# Patient Record
Sex: Male | Born: 1965 | Race: Black or African American | Hispanic: No | Marital: Single | Smoking: Current every day smoker
Health system: Southern US, Community
[De-identification: ages and names within clinical notes are randomized; demographics above are authoritative.]

## PROBLEM LIST (undated history)

## (undated) DIAGNOSIS — I749 Embolism and thrombosis of unspecified artery: Secondary | ICD-10-CM

## (undated) DIAGNOSIS — T783XXA Angioneurotic edema, initial encounter: Secondary | ICD-10-CM

## (undated) DIAGNOSIS — I10 Essential (primary) hypertension: Secondary | ICD-10-CM

## (undated) HISTORY — DX: Essential (primary) hypertension: I10

## (undated) HISTORY — PX: EMBOLECTOMY: SHX44

---

## 1998-02-27 ENCOUNTER — Emergency Department (HOSPITAL_COMMUNITY): Admission: EM | Admit: 1998-02-27 | Discharge: 1998-02-27 | Payer: Self-pay | Admitting: Emergency Medicine

## 1999-12-23 ENCOUNTER — Encounter: Payer: Self-pay | Admitting: Emergency Medicine

## 1999-12-23 ENCOUNTER — Emergency Department (HOSPITAL_COMMUNITY): Admission: EM | Admit: 1999-12-23 | Discharge: 1999-12-23 | Payer: Self-pay | Admitting: Emergency Medicine

## 2002-11-15 ENCOUNTER — Emergency Department (HOSPITAL_COMMUNITY): Admission: EM | Admit: 2002-11-15 | Discharge: 2002-11-15 | Payer: Self-pay | Admitting: Emergency Medicine

## 2002-11-18 ENCOUNTER — Emergency Department (HOSPITAL_COMMUNITY): Admission: EM | Admit: 2002-11-18 | Discharge: 2002-11-19 | Payer: Self-pay | Admitting: Emergency Medicine

## 2002-12-07 ENCOUNTER — Other Ambulatory Visit (HOSPITAL_COMMUNITY): Admission: RE | Admit: 2002-12-07 | Discharge: 2002-12-23 | Payer: Self-pay | Admitting: Psychiatry

## 2003-12-30 ENCOUNTER — Emergency Department (HOSPITAL_COMMUNITY): Admission: EM | Admit: 2003-12-30 | Discharge: 2003-12-30 | Payer: Self-pay | Admitting: Family Medicine

## 2004-02-29 ENCOUNTER — Emergency Department (HOSPITAL_COMMUNITY): Admission: EM | Admit: 2004-02-29 | Discharge: 2004-02-29 | Payer: Self-pay | Admitting: Emergency Medicine

## 2004-03-10 ENCOUNTER — Emergency Department (HOSPITAL_COMMUNITY): Admission: EM | Admit: 2004-03-10 | Discharge: 2004-03-10 | Payer: Self-pay | Admitting: Emergency Medicine

## 2004-06-10 ENCOUNTER — Emergency Department (HOSPITAL_COMMUNITY): Admission: EM | Admit: 2004-06-10 | Discharge: 2004-06-10 | Payer: Self-pay | Admitting: Emergency Medicine

## 2006-10-18 ENCOUNTER — Emergency Department (HOSPITAL_COMMUNITY): Admission: EM | Admit: 2006-10-18 | Discharge: 2006-10-18 | Payer: Self-pay | Admitting: Emergency Medicine

## 2007-04-16 ENCOUNTER — Emergency Department (HOSPITAL_COMMUNITY): Admission: EM | Admit: 2007-04-16 | Discharge: 2007-04-16 | Payer: Self-pay | Admitting: Emergency Medicine

## 2007-04-17 ENCOUNTER — Emergency Department (HOSPITAL_COMMUNITY): Admission: EM | Admit: 2007-04-17 | Discharge: 2007-04-17 | Payer: Self-pay | Admitting: Emergency Medicine

## 2008-05-15 ENCOUNTER — Emergency Department (HOSPITAL_COMMUNITY): Admission: EM | Admit: 2008-05-15 | Discharge: 2008-05-15 | Payer: Self-pay | Admitting: Emergency Medicine

## 2010-02-23 ENCOUNTER — Emergency Department (HOSPITAL_COMMUNITY)
Admission: EM | Admit: 2010-02-23 | Discharge: 2010-02-23 | Disposition: A | Discharge status: Home / Self Care | Payer: Self-pay | Attending: Emergency Medicine | Admitting: Emergency Medicine

## 2010-02-23 ENCOUNTER — Emergency Department (HOSPITAL_COMMUNITY): Payer: Self-pay

## 2010-02-23 DIAGNOSIS — R079 Chest pain, unspecified: Secondary | ICD-10-CM | POA: Insufficient documentation

## 2010-04-23 LAB — BASIC METABOLIC PANEL
Chloride: 105 mEq/L (ref 96–112)
Creatinine, Ser: 0.78 mg/dL (ref 0.4–1.5)
GFR calc Af Amer: >60 mL/min (ref >60)
GFR calc non Af Amer: >60 mL/min (ref >60)

## 2010-04-23 LAB — DIFFERENTIAL
Eosinophils Absolute: 0.2 10*3/uL (ref 0.0–0.7)
Lymphocytes Relative: 28 % (ref 12–46)
Lymphs Abs: 1.7 10*3/uL (ref 0.7–4.0)
Monocytes Relative: 8 % (ref 3–12)
Neutro Abs: 3.4 10*3/uL (ref 1.7–7.7)
Neutrophils Relative %: 60 % (ref 43–77)

## 2010-04-23 LAB — CBC
MCV: 92 fL (ref 78.0–100.0)
RBC: 4.17 MIL/uL — ABNORMAL LOW (ref 4.22–5.81)
WBC: 5.9 10*3/uL (ref 4.0–10.5)

## 2010-04-23 LAB — TROPONIN I: Troponin I: 0.01 ng/mL (ref 0.00–0.06)

## 2010-11-30 ENCOUNTER — Emergency Department (HOSPITAL_COMMUNITY)
Admission: EM | Admit: 2010-11-30 | Discharge: 2010-11-30 | Disposition: A | Discharge status: Home / Self Care | Payer: Self-pay | Attending: Emergency Medicine | Admitting: Emergency Medicine

## 2010-11-30 DIAGNOSIS — R220 Localized swelling, mass and lump, head: Secondary | ICD-10-CM | POA: Insufficient documentation

## 2010-11-30 DIAGNOSIS — F17200 Nicotine dependence, unspecified, uncomplicated: Secondary | ICD-10-CM | POA: Insufficient documentation

## 2010-11-30 DIAGNOSIS — L0390 Cellulitis, unspecified: Secondary | ICD-10-CM

## 2010-11-30 DIAGNOSIS — L0201 Cutaneous abscess of face: Secondary | ICD-10-CM | POA: Insufficient documentation

## 2010-11-30 DIAGNOSIS — R221 Localized swelling, mass and lump, neck: Secondary | ICD-10-CM | POA: Insufficient documentation

## 2010-11-30 DIAGNOSIS — L03211 Cellulitis of face: Secondary | ICD-10-CM | POA: Insufficient documentation

## 2010-11-30 HISTORY — DX: Embolism and thrombosis of unspecified artery: I74.9

## 2010-11-30 MED ORDER — OXYCODONE-ACETAMINOPHEN 5-325 MG PO TABS: 1.0000 | ORAL_TABLET | Freq: Four times a day (QID) | ORAL | Status: AC | PRN

## 2010-11-30 MED ORDER — DOXYCYCLINE HYCLATE 100 MG PO CAPS: 100.0000 mg | ORAL_CAPSULE | Freq: Two times a day (BID) | ORAL | Status: AC

## 2010-11-30 NOTE — ED Notes (Signed)
Pt c/o "angioedema". Denies taking medications that would cause this. States that these "breakouts" are occurring more frequently.

## 2010-11-30 NOTE — ED Provider Notes (Signed)
History     CSN: [id number] Arrival date & time: 11/30/2010  3:31 AM   First MD Initiated Contact with Patient 11/30/10 0355      Chief Complaint  Patient presents with  . Facial Swelling    right ear    HPI: The history is provided by the patient.  Reports noticing swelling to (R) side of face late yesterday that worsened through-out the night. 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  Past Medical History  Diagnosis Date  . Embolism - blood clot     in occipital lobe    Past Surgical History  Procedure Date  . Embolectomy     occipital lobe    Family History  Problem Relation Age of Onset  . Hypertension Mother     History  Substance Use Topics  . Smoking status: Current Everyday Smoker  . Smokeless tobacco: Not on file  . Alcohol Use: No      Review of Systems  Constitutional: Negative.   HENT: Negative.   Eyes: Negative.   Respiratory: Negative.   Cardiovascular: Negative.   Gastrointestinal: Negative.   Genitourinary: Negative.   Musculoskeletal: Negative.   Skin: Negative.   Neurological: Negative.   Hematological: Negative.   Psychiatric/Behavioral: Negative.     Allergies  Review of patient's allergies indicates no known allergies.  Home Medications  No current outpatient prescriptions on file.  BP 156/94  Pulse 76  Temp 97.6 F (36.4 C)  Resp 18  SpO2 99%  Physical Exam  Constitutional: He is oriented to person, place, and time. He appears well-developed and well-nourished.  HENT:  Head: Atraumatic.         Erythema and swelling to (R) earlobe that extends just anterior to the earlobe approx 1-2 cm and down the mandibular region approx 2-3 cm. Area is TTP. No discernable nodular or abscess formation or palpable fluctuance.  Eyes: Conjunctivae are normal.  Neck: Neck supple.  Cardiovascular: Normal rate and regular rhythm.   Pulmonary/Chest: Effort normal and breath sounds normal.  Abdominal: Soft.  Bowel sounds are normal.  Lymphadenopathy:    He has no cervical adenopathy.  Neurological: He is alert and oriented to person, place, and time.  Skin: Skin is warm and dry.  Psychiatric: He has a normal mood and affect.    ED Course  Procedures Clinical impression and d/c plan discussed. Will treat cellulitis and encourage warm compresses and f/u in 2 days for wound recheck at [facility]. Pt agreeable w/ plan.  cussed.   Labs Reviewed - No data to display No results found.   No diagnosis found.    MDM  Findings c/w localized cellulitis (?abscess).      Leanne Chang, NP 12/03/10 [phone]

## 2010-12-04 NOTE — ED Provider Notes (Signed)
Evaluation and management procedures were performed by the mid-level provider (PA/NP/CNM) under my supervision/collaboration. I was present and available during the ED course. Jamilya Sarrazin Y.   Gavin Pound. Ruairi Stutsman, MD 12/04/10 1008

## 2012-03-21 ENCOUNTER — Emergency Department (HOSPITAL_COMMUNITY)
Admission: EM | Admit: 2012-03-21 | Discharge: 2012-03-21 | Disposition: A | Discharge status: Home / Self Care | Payer: BC Managed Care – PPO | Attending: Emergency Medicine | Admitting: Emergency Medicine

## 2012-03-21 ENCOUNTER — Encounter (HOSPITAL_COMMUNITY): Payer: Self-pay | Admitting: Emergency Medicine

## 2012-03-21 DIAGNOSIS — R51 Headache: Secondary | ICD-10-CM

## 2012-03-21 DIAGNOSIS — F17200 Nicotine dependence, unspecified, uncomplicated: Secondary | ICD-10-CM | POA: Insufficient documentation

## 2012-03-21 DIAGNOSIS — Y92410 Unspecified street and highway as the place of occurrence of the external cause: Secondary | ICD-10-CM | POA: Insufficient documentation

## 2012-03-21 DIAGNOSIS — Y9389 Activity, other specified: Secondary | ICD-10-CM | POA: Insufficient documentation

## 2012-03-21 DIAGNOSIS — Z8679 Personal history of other diseases of the circulatory system: Secondary | ICD-10-CM | POA: Insufficient documentation

## 2012-03-21 DIAGNOSIS — S0990XA Unspecified injury of head, initial encounter: Secondary | ICD-10-CM | POA: Insufficient documentation

## 2012-03-21 MED ORDER — IBUPROFEN 200 MG PO TABS
600.0000 mg | ORAL_TABLET | Freq: Once | ORAL | Status: AC
  Administered 2012-03-21: 600 mg via ORAL
  Filled 2012-03-21: qty 1

## 2012-03-21 MED ORDER — HYDROCODONE-ACETAMINOPHEN 5-325 MG PO TABS: 1.0000 | ORAL_TABLET | ORAL | Status: DC | PRN

## 2012-03-21 NOTE — ED Notes (Signed)
Vision Check:  OU-both eyes- 20/50 OD-rt eye- 20/50 OS-lt eye-20/50
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
## 2012-03-24 ENCOUNTER — Emergency Department (HOSPITAL_COMMUNITY): Payer: BC Managed Care – PPO

## 2012-03-24 ENCOUNTER — Emergency Department (HOSPITAL_COMMUNITY)
Admission: EM | Admit: 2012-03-24 | Discharge: 2012-03-24 | Disposition: A | Discharge status: Home / Self Care | Payer: BC Managed Care – PPO | Attending: Emergency Medicine | Admitting: Emergency Medicine

## 2012-03-24 ENCOUNTER — Encounter (HOSPITAL_COMMUNITY): Payer: Self-pay | Admitting: Emergency Medicine

## 2012-03-24 DIAGNOSIS — R42 Dizziness and giddiness: Secondary | ICD-10-CM | POA: Insufficient documentation

## 2012-03-24 DIAGNOSIS — Z86718 Personal history of other venous thrombosis and embolism: Secondary | ICD-10-CM | POA: Insufficient documentation

## 2012-03-24 DIAGNOSIS — H53149 Visual discomfort, unspecified: Secondary | ICD-10-CM | POA: Insufficient documentation

## 2012-03-24 DIAGNOSIS — F17200 Nicotine dependence, unspecified, uncomplicated: Secondary | ICD-10-CM | POA: Insufficient documentation

## 2012-03-24 DIAGNOSIS — S0990XD Unspecified injury of head, subsequent encounter: Secondary | ICD-10-CM

## 2012-03-24 DIAGNOSIS — S0990XA Unspecified injury of head, initial encounter: Secondary | ICD-10-CM | POA: Insufficient documentation

## 2012-03-24 DIAGNOSIS — R51 Headache: Secondary | ICD-10-CM

## 2012-03-24 DIAGNOSIS — Y9389 Activity, other specified: Secondary | ICD-10-CM | POA: Insufficient documentation

## 2012-03-24 DIAGNOSIS — M542 Cervicalgia: Secondary | ICD-10-CM | POA: Insufficient documentation

## 2012-03-24 MED ORDER — HYDROMORPHONE HCL PF 1 MG/ML IJ SOLN
1.0000 mg | Freq: Once | INTRAMUSCULAR | Status: AC
  Administered 2012-03-24: 1 mg via INTRAVENOUS
  Filled 2012-03-24: qty 1

## 2012-03-24 MED ORDER — SODIUM CHLORIDE 0.9 % IV SOLN
Freq: Once | INTRAVENOUS | Status: AC
  Administered 2012-03-24: 21:00:00 via INTRAVENOUS

## 2012-03-24 MED ORDER — ONDANSETRON 8 MG PO TBDP
8.0000 mg | ORAL_TABLET | Freq: Once | ORAL | Status: AC
  Administered 2012-03-24: 8 mg via ORAL
  Filled 2012-03-24: qty 1
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
## 2012-03-24 NOTE — ED Notes (Signed)
Pt states that he got a sharp headache right behind his ear that he states was about a 6/10 on pain scale. States that the pain was so bad that it made him dizzy.  States this pain only lasted for a moment.  Now he is c/o a headache behind his right eye 3/10.  Denies dizziness at the moment.

## 2012-03-27 NOTE — ED Provider Notes (Signed)
Medical screening examination/treatment/procedure(s) were performed by non-physician practitioner and as supervising physician I was immediately available for consultation/collaboration.  Raeford Razor, MD 03/27/12 1046

## 2012-05-21 ENCOUNTER — Encounter (HOSPITAL_COMMUNITY): Payer: Self-pay | Admitting: *Deleted

## 2012-05-21 ENCOUNTER — Emergency Department (HOSPITAL_COMMUNITY)
Admission: EM | Admit: 2012-05-21 | Discharge: 2012-05-21 | Disposition: A | Discharge status: Home / Self Care | Payer: BC Managed Care – PPO | Attending: Emergency Medicine | Admitting: Emergency Medicine

## 2012-05-21 ENCOUNTER — Other Ambulatory Visit: Payer: Self-pay

## 2012-05-21 ENCOUNTER — Emergency Department (HOSPITAL_COMMUNITY): Payer: BC Managed Care – PPO

## 2012-05-21 DIAGNOSIS — Z87828 Personal history of other (healed) physical injury and trauma: Secondary | ICD-10-CM | POA: Insufficient documentation

## 2012-05-21 DIAGNOSIS — R071 Chest pain on breathing: Secondary | ICD-10-CM | POA: Insufficient documentation

## 2012-05-21 DIAGNOSIS — F17200 Nicotine dependence, unspecified, uncomplicated: Secondary | ICD-10-CM | POA: Insufficient documentation

## 2012-05-21 DIAGNOSIS — R0789 Other chest pain: Secondary | ICD-10-CM

## 2012-05-21 DIAGNOSIS — Z8679 Personal history of other diseases of the circulatory system: Secondary | ICD-10-CM | POA: Insufficient documentation

## 2012-05-21 DIAGNOSIS — R079 Chest pain, unspecified: Secondary | ICD-10-CM

## 2012-05-21 HISTORY — DX: Angioneurotic edema, initial encounter: T78.3XXA

## 2012-05-21 LAB — POCT I-STAT, CHEM 8
BUN: 4 mg/dL — ABNORMAL LOW (ref 6–23)
Calcium, Ion: 1.21 mmol/L (ref 1.12–1.23)
Chloride: 107 mEq/L (ref 96–112)
Creatinine, Ser: 1 mg/dL (ref 0.50–1.35)
HCT: 41 % (ref 39.0–52.0)
Potassium: 3.9 mEq/L (ref 3.5–5.1)
Sodium: 143 mEq/L (ref 135–145)

## 2012-05-21 LAB — POCT I-STAT TROPONIN I: Troponin i, poc: 0 ng/mL (ref 0.00–0.08)

## 2012-05-21 MED ORDER — IBUPROFEN 600 MG PO TABS: 600.0000 mg | ORAL_TABLET | Freq: Four times a day (QID) | ORAL | Status: DC | PRN

## 2012-05-21 MED ORDER — IBUPROFEN 800 MG PO TABS
800.0000 mg | ORAL_TABLET | Freq: Once | ORAL | Status: AC
  Administered 2012-05-21: 800 mg via ORAL
  Filled 2012-05-21: qty 1

## 2012-05-21 MED ORDER — ASPIRIN 81 MG PO CHEW
324.0000 mg | CHEWABLE_TABLET | Freq: Once | ORAL | Status: AC
  Administered 2012-05-21: 324 mg via ORAL
  Filled 2012-05-21: qty 4

## 2012-05-21 NOTE — ED Notes (Signed)
Per pt report: Pt c/o of chest pain that began at 19:00 that subsided but then came back around 1:00.  Pt describes it as "some times it was pain and sometimes it was pressure, sometimes it was both."  Pt was at work when the pain started.  Pt was sitting down and got up and felt the  Pain. Pt denies n/v, sob, dizziness, fatigue. Pt reports smoking. Pt a/o x 4.  Skin warm and dry.
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
## 2012-11-27 ENCOUNTER — Encounter (HOSPITAL_COMMUNITY): Payer: Self-pay | Admitting: Emergency Medicine

## 2012-11-27 ENCOUNTER — Emergency Department (HOSPITAL_COMMUNITY)
Admission: EM | Admit: 2012-11-27 | Discharge: 2012-11-27 | Disposition: A | Discharge status: Home / Self Care | Payer: BC Managed Care – PPO | Attending: Emergency Medicine | Admitting: Emergency Medicine

## 2012-11-27 DIAGNOSIS — Z86711 Personal history of pulmonary embolism: Secondary | ICD-10-CM | POA: Insufficient documentation

## 2012-11-27 DIAGNOSIS — F17200 Nicotine dependence, unspecified, uncomplicated: Secondary | ICD-10-CM | POA: Insufficient documentation

## 2012-11-27 DIAGNOSIS — I10 Essential (primary) hypertension: Secondary | ICD-10-CM | POA: Insufficient documentation

## 2012-11-27 DIAGNOSIS — IMO0002 Reserved for concepts with insufficient information to code with codable children: Secondary | ICD-10-CM | POA: Insufficient documentation

## 2012-11-27 DIAGNOSIS — T783XXA Angioneurotic edema, initial encounter: Secondary | ICD-10-CM | POA: Insufficient documentation

## 2012-11-27 MED ORDER — DIPHENHYDRAMINE HCL 50 MG/ML IJ SOLN
50.0000 mg | Freq: Once | INTRAMUSCULAR | Status: AC
  Administered 2012-11-27: 50 mg via INTRAVENOUS
  Filled 2012-11-27: qty 1

## 2012-11-27 MED ORDER — DIPHENHYDRAMINE HCL 25 MG PO TABS: 25.0000 mg | ORAL_TABLET | Freq: Four times a day (QID) | ORAL | Status: DC

## 2012-11-27 MED ORDER — FAMOTIDINE 20 MG PO TABS
20.0000 mg | ORAL_TABLET | Freq: Once | ORAL | Status: AC
  Administered 2012-11-27: 20 mg via ORAL
  Filled 2012-11-27: qty 1

## 2012-11-27 MED ORDER — METHYLPREDNISOLONE SODIUM SUCC 125 MG IJ SOLR
125.0000 mg | Freq: Once | INTRAMUSCULAR | Status: AC
  Administered 2012-11-27: 125 mg via INTRAVENOUS
  Filled 2012-11-27: qty 2

## 2012-11-27 MED ORDER — PREDNISONE 10 MG PO TABS: 60.0000 mg | ORAL_TABLET | Freq: Every day | ORAL | Status: DC

## 2012-11-27 NOTE — ED Provider Notes (Signed)
CSN: [id number]     Arrival date & time 11/27/12  0016 History   First MD Initiated Contact with Patient 11/27/12 0023     Chief Complaint  Patient presents with  . Angioedema    The history is provided by the patient.   patient reports swelling of his upper lip over the past hour.  He states it is worsened slightly.  No difficulty breathing or swallowing.  He states his head angioedema before in the past and this feels similar.  He is not on ACE inhibitors.  He denies use of anti-inflammatory medications today.  He states this has occurred for the times before in the past.  He seen an allergist/immunologist without an etiology as to the cause of his symptoms.  His last episode occurred approximately a year and a half ago he was seen in outside hospital.  He's never had difficulty breathing or swallowing.  He's never been intubated for his angioedema.  It always seems to be isolated to his lips.  It is usually his upper lip but has involved his lower lip before.   Past Medical History  Diagnosis Date  . 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 Angioedema    Past Surgical History  Procedure Laterality Date  . Embolectomy      occipital lobe   Family History  Problem Relation Age of Onset  . Hypertension Mother    History  Substance Use Topics  . Smoking status: Current Every Day Smoker    Types: Cigars  . Smokeless tobacco: Not on file  . Alcohol Use: Yes     Comment: Social    Review of Systems  All other systems reviewed and are negative.    Allergies  Review of patient's allergies indicates no known allergies.  Home Medications   Current Outpatient Rx  Name  Route  Sig  Dispense  Refill  . diphenhydrAMINE (BENADRYL) 25 MG tablet   Oral   Take 1 tablet (25 mg total) by mouth every 6 (six) hours.   20 tablet   0   . predniSONE (DELTASONE) 10 MG tablet   Oral   Take 6 tablets (60 mg total) by mouth daily.   30 tablet   0    BP 125/80  Pulse 82   Temp(Src) 98.3 F (36.8 C) (Oral)  Resp 18  Ht 5\' 8"  (1.727 m)  Wt 203 lb (92.08 kg)  BMI 30.87 kg/m2  SpO2 97% Physical Exam  Nursing note and vitals reviewed. Constitutional: He is oriented to person, place, and time. He appears well-developed and well-nourished.  HENT:  Head: Normocephalic and atraumatic.  Angioedema right upper.  No tongue involvement.  Uvula is midline.  No uvular swelling.  Oral airway is patent.  Tolerating secretions.  No stridor.  Eyes: EOM are normal.  Neck: Normal range of motion.  Cardiovascular: Normal rate, regular rhythm, normal heart sounds and intact distal pulses.   Pulmonary/Chest: Effort normal and breath sounds normal. No stridor. No respiratory distress.  Abdominal: Soft. He exhibits no distension. There is no tenderness.  Musculoskeletal: Normal range of motion.  Neurological: He is alert and oriented to person, place, and time.  Skin: Skin is warm and dry.  Psychiatric: He has a normal mood and affect. Judgment normal.    ED Course  Procedures (including critical care time) Labs Review Labs Reviewed - No data to display Imaging Review No results found.  EKG Interpretation   None  MDM   1. 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  He is not on ACE inhibitors.  He has had no anti-inflammatories today.  He has seen the allergist without a true diagnosis as to why this occurs.  Patient was observed in the ER for 4 hours.  His had no worsening of his symptoms.  Seems to be isolated to his upper lip.  Discharge home in good condition.    Lyanne Co, MD 11/27/12 [phone]

## 2012-11-27 NOTE — ED Notes (Signed)
Pt presents w/ significant swelling to right side of his upper lip.  Per pt this has happened in the past.  Approximately 40 mins. Ago pt began to feel a "tingling" sensation and that is when the swelling began.  Pt denies any tingling in his tongue or throat.

## 2012-11-27 NOTE — ED Notes (Signed)
The swelling in pt's right upper lip is extending into left side of upper lip at this time.  Respiratory effort remains unlabored and pt continues to deny any tingling sensation in his tongue or throat.

## 2013-08-07 ENCOUNTER — Encounter (HOSPITAL_COMMUNITY): Payer: Self-pay | Admitting: Emergency Medicine

## 2013-08-07 ENCOUNTER — Emergency Department (HOSPITAL_COMMUNITY)
Admission: EM | Admit: 2013-08-07 | Discharge: 2013-08-07 | Disposition: A | Discharge status: Home / Self Care | Payer: BC Managed Care – PPO | Attending: Emergency Medicine | Admitting: Emergency Medicine

## 2013-08-07 DIAGNOSIS — R079 Chest pain, unspecified: Secondary | ICD-10-CM

## 2013-08-07 DIAGNOSIS — Z86718 Personal history of other venous thrombosis and embolism: Secondary | ICD-10-CM | POA: Insufficient documentation

## 2013-08-07 DIAGNOSIS — F17200 Nicotine dependence, unspecified, uncomplicated: Secondary | ICD-10-CM | POA: Insufficient documentation

## 2013-08-07 DIAGNOSIS — Z79899 Other long term (current) drug therapy: Secondary | ICD-10-CM | POA: Insufficient documentation

## 2013-08-07 LAB — CBC
HEMATOCRIT: 39.6 % (ref 39.0–52.0)
Hemoglobin: 13.6 g/dL (ref 13.0–17.0)
MCH: 30.4 pg (ref 26.0–34.0)
MCHC: 34.3 g/dL (ref 30.0–36.0)
MCV: 88.6 fL (ref 78.0–100.0)
PLATELETS: 271 10*3/uL (ref 150–400)
RBC: 4.47 MIL/uL (ref 4.22–5.81)
RDW: 14.1 % (ref 11.5–15.5)
WBC: 8.5 10*3/uL (ref 4.0–10.5)

## 2013-08-07 LAB — I-STAT TROPONIN, ED: Troponin i, poc: 0 ng/mL (ref 0.00–0.08)

## 2013-08-07 LAB — BASIC METABOLIC PANEL
ANION GAP: 11 (ref 5–15)
BUN: 8 mg/dL (ref 6–23)
CALCIUM: 9.2 mg/dL (ref 8.4–10.5)
CHLORIDE: 105 meq/L (ref 96–112)
CO2: 26 mEq/L (ref 19–32)
CREATININE: 0.83 mg/dL (ref 0.50–1.35)
GFR calc Af Amer: >90 mL/min (ref >90)
Glucose, Bld: 100 mg/dL — ABNORMAL HIGH (ref 70–99)
Potassium: 4 mEq/L (ref 3.7–5.3)
Sodium: 142 mEq/L (ref 137–147)

## 2013-08-07 MED ORDER — PANTOPRAZOLE SODIUM 20 MG PO TBEC: 20.0000 mg | DELAYED_RELEASE_TABLET | Freq: Every day | ORAL | Status: AC

## 2013-08-07 MED ORDER — ASPIRIN 81 MG PO CHEW
324.0000 mg | CHEWABLE_TABLET | Freq: Once | ORAL | Status: AC
  Administered 2013-08-07: 324 mg via ORAL

## 2013-08-07 MED ORDER — GI COCKTAIL ~~LOC~~
30.0000 mL | Freq: Once | ORAL | Status: AC
  Administered 2013-08-07: 30 mL via ORAL
  Filled 2013-08-07: qty 30

## 2013-08-07 MED ORDER — ASPIRIN 81 MG PO CHEW
CHEWABLE_TABLET | ORAL | Status: AC
  Filled 2013-08-07: qty 4

## 2013-08-07 NOTE — ED Provider Notes (Signed)
CSN: [id number]     Arrival date & time 08/07/13  1324 History   First MD Initiated Contact with Patient 08/07/13 1345     Chief Complaint  Patient presents with  . Chest Pain     (Consider location/radiation/quality/duration/timing/severity/associated sxs/prior Treatment) Patient is a 48 y.o. male presenting with chest pain. The history is provided by the patient.  Chest Pain  He presents for evaluation of right anterior chest pain, present for 4 or 5 days, waxing and waning. The discomfort has been as bad as 8/10, and on arrival is 4/10. The pain is pressure-like. There is no associated fever, chills, cough, shortness of breath, nausea, vomiting, or diaphoresis. He has never had this previously. He denies problems eating, walking, or taking  medications. There are no other known modifying factors.   Past Medical History  Diagnosis Date  . 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 Angioedema    Past Surgical History  Procedure Laterality Date  . Embolectomy      occipital lobe   Family History  Problem Relation Age of Onset  . Hypertension Mother    History  Substance Use Topics  . Smoking status: Current Every Day Smoker    Types: Cigars  . Smokeless tobacco: Not on file  . Alcohol Use: Yes     Comment: Social    Review of Systems  Cardiovascular: Positive for chest pain.  All other systems reviewed and are negative.     Allergies  Review of patient's allergies indicates no known allergies.  Home Medications   Prior to Admission medications   Medication Sig Start Date End Date Taking? Authorizing Provider  pantoprazole (PROTONIX) 20 MG tablet Take 1 tablet (20 mg total) by mouth daily. 08/07/13   Flint MelterElliott L Treshawn Allen, MD   BP 146/99  Pulse 65  Temp(Src) 98.3 F (36.8 C) (Oral)  Resp 14  SpO2 100% Physical Exam  Nursing note and vitals reviewed. Constitutional: He is oriented to person, place, and time. He appears well-developed and well-nourished. No  distress.  HENT:  Head: Normocephalic and atraumatic.  Right Ear: External ear normal.  Left Ear: External ear normal.  Eyes: Conjunctivae and EOM are normal. Pupils are equal, round, and reactive to light.  Neck: Normal range of motion and phonation normal. Neck supple.  Cardiovascular: Normal rate, regular rhythm, normal heart sounds and intact distal pulses.   Pulmonary/Chest: Effort normal and breath sounds normal. He exhibits no tenderness and no bony tenderness.  Abdominal: Soft. There is no tenderness.  Musculoskeletal: Normal range of motion.  Neurological: He is alert and oriented to person, place, and time. No cranial nerve deficit or sensory deficit. He exhibits normal muscle tone. Coordination normal.  Skin: Skin is warm, dry and intact.  Psychiatric: He has a normal mood and affect. His behavior is normal. Judgment and thought content normal.    ED Course  Procedures (including critical care time)  13:55- completed call with, Dr. Herbie BaltimoreHarding, on call for STEMI; he and I agree, that the EKG today (13:32;00) does not meet criteria for ST segment elevation MI.  Medications  aspirin chewable tablet 324 mg (324 mg Oral Given 08/07/13 1351)  gi cocktail (Maalox,Lidocaine,Donnatal) (30 mLs Oral Given 08/07/13 1555)    Patient Vitals for the past 24 hrs:  BP Temp Temp src Pulse Resp SpO2  08/07/13 1555 146/99 mmHg - - 65 14 100 %  08/07/13 1335 137/85 mmHg 98.3 F (36.8 C) Oral 70  18 100 %   3:50 PM Reevaluation with update and discussion. After initial assessment and treatment, an updated evaluation reveals he, states that the chest pain is almost gone, but he is having pain with swallowing and is being bothered by hicoughs. Agness Sibrian L   4:16 PM Reevaluation with update and discussion. After initial assessment and treatment, an updated evaluation reveals he feels some better after GI cocktail. Findings discussed with patient, all questions answered.Mancel Bale L   Labs  Review Labs Reviewed  BASIC METABOLIC PANEL - Abnormal; Notable for the following:    Glucose, Bld 100 (*)    All other components within normal limits  CBC  I-STAT TROPOININ, ED    Imaging Review No results found.   EKG Interpretation   Date/Time:  Sunday August 07 2013 13:32:00 EDT Ventricular Rate:  73 PR Interval:  173 QRS Duration: 111 QT Interval:  378 QTC Calculation: 416 R Axis:   -11 Text Interpretation:  Sinus rhythm Probable left atrial enlargement  Nonspecific T abnormalities, inferior leads Minimal ST elevation, anterior  leads Baseline wander in lead(s) V6 since last tracing no significant  change Confirmed by Effie Shy  MD, Mechele Collin ([id number]) on 08/07/2013 1:55:01 PM      MDM   Final diagnoses:  Nonspecific chest pain    Nonspecific chest pain, low risk for coronary disease, and no clear evidence for ACS. The symptoms seem to GI related. He is stable for discharge with outpatient evaluation and treatment. The patient goes to the [place], in [place], [place].  Nursing Notes Reviewed/ Care Coordinated Applicable Imaging Reviewed Interpretation of Laboratory Data incorporated into ED treatment  The patient appears reasonably screened and/or stabilized for discharge and I doubt any other medical condition or other [facility] requiring further screening, evaluation, or treatment in the ED at this time prior to discharge.  Plan: Home Medications- Protonix; Home Treatments- rest; return here if the recommended treatment, does not improve the symptoms; Recommended follow up- PCP prn    Flint Melter, MD 08/08/13 1200

## 2013-08-07 NOTE — Discharge Instructions (Signed)
Use Maalox before meals and at bedtime for one week. See, your physician, for further evaluation; within one week.  Chest Pain (Nonspecific) It is often hard to give a specific diagnosis for the cause of chest pain. There is always a chance that your pain could be related to something serious, such as a heart attack or a blood clot in the lungs. You need to follow up with your health care provider for further evaluation. CAUSES   Heartburn.  Pneumonia or bronchitis.  Anxiety or stress.  Inflammation around your heart (pericarditis) or lung (pleuritis or pleurisy).  A blood clot in the lung.  A collapsed lung (pneumothorax). It can develop suddenly on its own (spontaneous pneumothorax) or from trauma to the chest.  Shingles infection (herpes zoster virus). The chest wall is composed of bones, muscles, and cartilage. Any of these can be the source of the pain.  The bones can be bruised by injury.  The muscles or cartilage can be strained by coughing or overwork.  The cartilage can be affected by inflammation and become sore (costochondritis). DIAGNOSIS  Lab tests or other studies may be needed to find the cause of your pain. Your health care provider may have you take a test called an ambulatory electrocardiogram (ECG). An ECG records your heartbeat patterns over a 24-hour period. You may also have other tests, such as:  Transthoracic echocardiogram (TTE). During echocardiography, sound waves are used to evaluate how blood flows through your heart.  Transesophageal echocardiogram (TEE).  Cardiac monitoring. This allows your health care provider to monitor your heart rate and rhythm in real time.  Holter monitor. This is a portable device that records your heartbeat and can help diagnose heart arrhythmias. It allows your health care provider to track your heart activity for several days, if needed.  Stress tests by exercise or by giving medicine that makes the heart beat  faster. TREATMENT   Treatment depends on what may be causing your chest pain. Treatment may include:  Acid blockers for heartburn.  Anti-inflammatory medicine.  Pain medicine for inflammatory conditions.  Antibiotics if an infection is present.  You may be advised to change lifestyle habits. This includes stopping smoking and avoiding alcohol, caffeine, and chocolate.  You may be advised to keep your head raised (elevated) when sleeping. This reduces the chance of acid going backward from your stomach into your esophagus. Most of the time, nonspecific chest pain will improve within 2-3 days with rest and mild pain medicine.  HOME CARE INSTRUCTIONS   If antibiotics were prescribed, take them as directed. Finish them even if you start to feel better.  For the next few days, avoid physical activities that bring on chest pain. Continue physical activities as directed.  Do not use any tobacco products, including cigarettes, chewing tobacco, or electronic cigarettes.  Avoid drinking alcohol.  Only take medicine as directed by your health care provider.  Follow your health care provider's suggestions for further testing if your chest pain does not go away.  Keep any follow-up appointments you made. If you do not go to an appointment, you could develop lasting (chronic) problems with pain. If there is any problem keeping an appointment, call to reschedule. SEEK MEDICAL CARE IF:   Your chest pain does not go away, even after treatment.  You have a rash with blisters on your chest.  You have a fever. SEEK IMMEDIATE MEDICAL CARE IF:   You have increased chest pain or pain that spreads to your arm,  neck, jaw, back, or abdomen.  You have shortness of breath.  You have an increasing cough, or you cough up blood.  You have severe back or abdominal pain.  You feel nauseous or vomit.  You have severe weakness.  You faint.  You have chills. This is an emergency. Do not wait to  see if the pain will go away. Get medical help at once. Call your local emergency services (911 in [place] Do not drive yourself to the hospital. MAKE SURE YOU:   Understand these instructions.  Will watch your condition.  Will get help right away if you are not doing well or get worse. Document Released: 10/09/2004 Document Revised: 01/04/2013 Document Reviewed: 08/05/2007 [facility] Patient Information 2015 [place], [place]. This information is not intended to replace advice given to you by your health care provider. Make sure you discuss any questions you have with your health care provider.

## 2013-08-07 NOTE — ED Notes (Signed)
Pt states that he has had rt sided chest pressure x 4-5 days.  States that he has been having hiccups on and off.  Hiccups started again this morning.  States that they woke him up out of his sleep.  Pt has hiccups now.

## 2014-05-14 ENCOUNTER — Emergency Department (HOSPITAL_COMMUNITY)
Admission: EM | Admit: 2014-05-14 | Discharge: 2014-05-14 | Disposition: A | Discharge status: Home / Self Care | Payer: Self-pay | Attending: Emergency Medicine | Admitting: Emergency Medicine

## 2014-05-14 ENCOUNTER — Encounter (HOSPITAL_COMMUNITY): Payer: Self-pay | Admitting: *Deleted

## 2014-05-14 DIAGNOSIS — Z79899 Other long term (current) drug therapy: Secondary | ICD-10-CM | POA: Insufficient documentation

## 2014-05-14 DIAGNOSIS — M545 Low back pain, unspecified: Secondary | ICD-10-CM

## 2014-05-14 DIAGNOSIS — Z8679 Personal history of other diseases of the circulatory system: Secondary | ICD-10-CM | POA: Insufficient documentation

## 2014-05-14 DIAGNOSIS — Z720 Tobacco use: Secondary | ICD-10-CM | POA: Insufficient documentation

## 2014-05-14 MED ORDER — MELOXICAM 7.5 MG PO TABS: 7.5000 mg | ORAL_TABLET | Freq: Every day | ORAL | Status: AC

## 2014-05-14 MED ORDER — METHOCARBAMOL 500 MG PO TABS: 500.0000 mg | ORAL_TABLET | Freq: Two times a day (BID) | ORAL | Status: AC

## 2014-05-14 MED ORDER — TRAMADOL HCL 50 MG PO TABS: 50.0000 mg | ORAL_TABLET | Freq: Four times a day (QID) | ORAL | Status: AC | PRN

## 2014-05-14 NOTE — ED Provider Notes (Signed)
CSN: [id number]     Arrival date & time 05/14/14  2136 History   None    This chart was scribed for non-physician practitioner, Junius FinnerErin O'Malley PA-C, working with No att. providers found by Arlan OrganAshley Leger, ED Scribe. This patient was seen in room WTR6/WTR6 and the patient's care was started at 10:07 PM.   Chief Complaint  Patient presents with  . Back Pain   The history is provided by the patient. No language interpreter was used.    HPI Comments: Devin Strickland is a 49 y.o. male without any pertinent past medical history who presents to the Emergency Department complaining of constant, ongoing, unchanged, non-radiating lower back pain x 2 days. Pain is constant, aching, sharp and shooting, 10/10 at worst. No recent injury or trauma. Pain is exacerbated at night time and with certain positions. Pt has tried prescribed Flexeril from previous illness without any improvement for symptoms. No recent fever, chills, numbness, loss of sensation, or paresthesia. He denies any bowel or urinary incontinence. No known allergies to medications. [facility] for [facility]22/14 but states he cannot wait until then with this pain.    Past Medical History  Diagnosis Date  . 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 Angioedema    Past Surgical History  Procedure Laterality Date  . Embolectomy      occipital lobe   Family History  Problem Relation Age of Onset  . Hypertension Mother    History  Substance Use Topics  . Smoking status: Current Every Day Smoker    Types: Cigars  . Smokeless tobacco: Not on file  . Alcohol Use: Yes     Comment: Social    Review of Systems  Constitutional: Negative for fever and chills.  Genitourinary: Negative for dysuria.  Musculoskeletal: Positive for back pain.  Skin: Negative for rash.  Neurological: Negative for weakness and numbness.  Psychiatric/Behavioral: Negative for confusion.    Allergies  Review of patient's  allergies indicates no known allergies.  Home Medications   Prior to Admission medications   Medication Sig Start Date End Date Taking? Authorizing Provider  meloxicam (MOBIC) 7.5 MG tablet Take 1 tablet (7.5 mg total) by mouth daily. 05/14/14   Junius FinnerErin O'Malley, PA-C  methocarbamol (ROBAXIN) 500 MG tablet Take 1 tablet (500 mg total) by mouth 2 (two) times daily. 05/14/14   Junius FinnerErin O'Malley, PA-C  pantoprazole (PROTONIX) 20 MG tablet Take 1 tablet (20 mg total) by mouth daily. 08/07/13   Mancel BaleElliott Wentz, MD  traMADol (ULTRAM) 50 MG tablet Take 1 tablet (50 mg total) by mouth every 6 (six) hours as needed. 05/14/14   Junius FinnerErin O'Malley, PA-C   Triage Vitals: BP 155/87 mmHg  Pulse 70  Temp(Src) 98.3 F (36.8 C) (Oral)  Resp 18  SpO2 98%   Physical Exam  Constitutional: He is oriented to person, place, and time. He appears well-developed and well-nourished.  HENT:  Head: Normocephalic and atraumatic.  Eyes: EOM are normal.  Neck: Normal range of motion. Neck supple.  No midline bone tenderness, no crepitus or step-offs.   Cardiovascular: Normal rate.   Pulmonary/Chest: Effort normal. No respiratory distress.  Abdominal: Soft. There is no tenderness.  Musculoskeletal: Normal range of motion. He exhibits tenderness.  Tenderness to palpation to lower lumbar spine and muscles FROM  Neurological: He is alert and oriented to person, place, and time.  Normal gait noted 5/5 strength to  all extremities Sensation inact  Skin: Skin is warm and dry.  Psychiatric: He has a normal mood and affect. His behavior is normal.  Nursing note and vitals reviewed.   ED Course  Procedures (including critical care time)  DIAGNOSTIC STUDIES: Oxygen Saturation is 98% on RA, Normal by my interpretation.    COORDINATION OF CARE: 10:13 PM- Will discharge home with pain medication. Discussed treatment plan with pt at bedside and pt agreed to plan.     Labs Review Labs Reviewed - No data to display  Imaging Review No  results found.   EKG Interpretation None      MDM   Final diagnoses:  Bilateral low back pain without sciatica    Pt presenting to ED with c/o lower back pain that started 2 days ago. No known injury. No red flag symptoms.  Pt tender with palaption, normal gait and strength. No imaging indicated at this time. Advised pt to discontinue flexeril, will prescribe robaxin, tramadol and mobic. Home care instructions provided. Advised to f/u as previously scheduled for 05/22/14. Return precautions provided. Pt verbalized understanding and agreement with tx plan.   I personally performed the services described in this documentation, which was scribed in my presence. The recorded information has been reviewed and is accurate.   Junius Finner, PA-C 05/14/14 2227  Doug Sou, MD 05/14/14 2322

## 2014-05-14 NOTE — ED Notes (Signed)
Pt reports lower back pain x 2 days; pt states that the pain is worse at night; pt denies injury to back; pt denies numbness and tingling; pt states that the pain is to the lower spine buttock area; pt ambulatory without difficulty

## 2015-07-24 ENCOUNTER — Emergency Department (HOSPITAL_COMMUNITY)
Admission: EM | Admit: 2015-07-24 | Discharge: 2015-07-24 | Disposition: A | Discharge status: Home / Self Care | Payer: No Typology Code available for payment source | Attending: Emergency Medicine | Admitting: Emergency Medicine

## 2015-07-24 DIAGNOSIS — T63441A Toxic effect of venom of bees, accidental (unintentional), initial encounter: Secondary | ICD-10-CM | POA: Insufficient documentation

## 2015-07-24 DIAGNOSIS — T63481A Toxic effect of venom of other arthropod, accidental (unintentional), initial encounter: Secondary | ICD-10-CM

## 2015-07-24 DIAGNOSIS — F17210 Nicotine dependence, cigarettes, uncomplicated: Secondary | ICD-10-CM | POA: Insufficient documentation

## 2015-07-24 DIAGNOSIS — Z791 Long term (current) use of non-steroidal anti-inflammatories (NSAID): Secondary | ICD-10-CM | POA: Insufficient documentation

## 2015-07-24 DIAGNOSIS — Z79899 Other long term (current) drug therapy: Secondary | ICD-10-CM | POA: Insufficient documentation

## 2015-07-24 MED ORDER — PREDNISONE 10 MG PO TABS: 40.0000 mg | ORAL_TABLET | Freq: Every day | ORAL | Status: AC

## 2015-07-24 MED ORDER — PREDNISONE 20 MG PO TABS
40.0000 mg | ORAL_TABLET | Freq: Once | ORAL | Status: AC
  Administered 2015-07-24: 40 mg via ORAL
  Filled 2015-07-24: qty 2

## 2015-07-24 MED ORDER — DIPHENHYDRAMINE HCL 25 MG PO TABS: 25.0000 mg | ORAL_TABLET | Freq: Four times a day (QID) | ORAL | Status: AC | PRN

## 2015-07-24 MED ORDER — FAMOTIDINE 20 MG PO TABS: 20.0000 mg | ORAL_TABLET | Freq: Two times a day (BID) | ORAL | Status: AC

## 2015-07-24 MED ORDER — FAMOTIDINE 20 MG PO TABS
20.0000 mg | ORAL_TABLET | Freq: Once | ORAL | Status: AC
  Administered 2015-07-24: 20 mg via ORAL
  Filled 2015-07-24: qty 1

## 2015-07-24 NOTE — Discharge Instructions (Signed)
Bee, Wasp, or [employer], wasps, and hornets are part of a family of insects that can sting people. These stings can cause pain and inflammation, but they are usually not serious. However, some people may have an allergic reaction to a sting. This can cause the symptoms to be more severe.  SYMPTOMS  Common symptoms of this condition include:   A red lump in the skin that sometimes has a tiny hole in the center. In some cases, a stinger may be in the center of the wound.  Pain and itching at the sting site.  Redness and swelling around the sting site. If you have an allergic reaction (localized allergic reaction), the swelling and redness may spread out from the sting site. In some cases, this reaction can continue to develop over the next 12-36 hours. In rare cases, a person may have a severe allergic reaction (anaphylactic reaction) to a sting. Symptoms of an anaphylactic reaction may include:   Wheezing or difficulty breathing.  Raised, itchy, red patches on the skin.  Nausea or vomiting.  Abdominal cramping.  Diarrhea.  Chest pain.  Fainting.  Redness of the face (flushing). DIAGNOSIS  This condition is usually diagnosed based on symptoms, medical history, and a physical exam. TREATMENT  Most stings can be treated with:   Icing to reduce swelling.  Medicines (antihistamines) to treat itching or an allergic reaction.  Medicines to help reduce pain. These may be medicines that you take by mouth, or medicated creams or lotions that you apply to your skin. If you were stung by a bee, the stinger and a small sac of poison may be in the wound. This may be removed by brushing across it with a flat card, such as a credit card. Another method is to pinch the area and pull it out. These methods can help reduce the severity of the body's reaction to the sting.  HOME CARE INSTRUCTIONS   Wash the sting site daily with soap and water as told by your health care provider.  Apply  or take over-the-counter and prescription medicines only as told by your health care provider.  If directed, apply ice to the sting area.  Put ice in a plastic bag.  Place a towel between your skin and the bag.  Leave the ice on for 20 minutes, 2-3 times per day.  Do not scratch the sting area.  To lessen pain, try using a paste that is made of water and baking soda. Rub the paste on the sting area and leave it on for 5 minutes.  If you had a severe allergic reaction to a sting, you may need:  To wear a medical bracelet or necklace that lists the allergy.  To learn when and how to use an anaphylaxis kit or epinephrine injection. Your family members may also need to learn this.  To carry an anaphylaxis kit with you at all times. SEEK MEDICAL CARE IF:   Your symptoms do not get better in 2-3 days.  You have redness, swelling, or pain that spreads beyond the area of the sting.  You have a fever. SEEK IMMEDIATE MEDICAL CARE IF:  You have symptoms of a severe allergic reaction. These include:   Wheezing or difficulty breathing.  Chest pain.  Light-headedness or fainting.  Itchy, raised, red patches on the skin.  Nausea or vomiting.  Abdominal cramping.  Diarrhea.   This information is not intended to replace advice given to you by your health care provider.  Make sure you discuss any questions you have with your health care provider.   Document Released: 12/30/2004 Document Revised: 09/20/2014 Document Reviewed: 05/17/2014 Elsevier Interactive Patient Education 2016 [employer]  Angioedema Angioedema is a sudden swelling of tissues, often of the skin. It can occur on the face or genitals or in the abdomen or other body parts. The swelling usually develops over a short period and gets better in 24 to 48 hours. It often begins during the night and is found when the person wakes up. The person may also get red, itchy patches of skin (hives). Angioedema can be  dangerous if it involves swelling of the air passages.  Depending on the cause, episodes of angioedema may only happen once, come back in unpredictable patterns, or repeat for several years and then gradually fade away.  CAUSES  Angioedema can be caused by an allergic reaction to various triggers. It can also result from nonallergic causes, including reactions to drugs, immune system disorders, viral infections, or an abnormal gene that is passed to you from your parents (hereditary). For some people with angioedema, the cause is unknown.  Some things that can trigger angioedema include:   Foods.   Medicines, such as ACE inhibitors, ARBs, nonsteroidal anti-inflammatory agents, or estrogen.   Latex.   Animal saliva.   Insect stings.   Dyes used in X-rays.   Mild injury.   Dental work.  Surgery.  Stress.   Sudden changes in temperature.   Exercise. SIGNS AND SYMPTOMS   Swelling of the skin.  Hives. If these are present, there is also intense itching.  Redness in the affected area.   Pain in the affected area.  Swollen lips or tongue.  Breathing problems. This may happen if the air passages swell.  Wheezing. If internal organs are involved, there may be:   Nausea.   Abdominal pain.   Vomiting.   Difficulty swallowing.   Difficulty passing urine. DIAGNOSIS   Your health care provider will examine the affected area and take a medical and family history.  Various tests may be done to help determine the cause. Tests may include:  Allergy skin tests to see if the problem is an allergic reaction.   Blood tests to check for hereditary angioedema.   Tests to check for underlying diseases that could cause the condition.   A review of your medicines, including over-the-counter medicines, may be done. TREATMENT  Treatment will depend on the cause of the angioedema. Possible treatments include:   Removal of anything that triggered the condition  (such as stopping certain medicines).   Medicines to treat symptoms or prevent attacks. Medicines given may include:   Antihistamines.   Epinephrine injection.   Steroids.   Hospitalization may be required for severe attacks. If the air passages are affected, it can be an emergency. Tubes may need to be placed to keep the airway open. HOME CARE INSTRUCTIONS   Take all medicines as directed by your health care provider.  If you were given medicines for emergency allergy treatment, always carry them with you.  Wear a medical bracelet as directed by your health care provider.   Avoid known triggers. SEEK MEDICAL CARE IF:   You have repeat attacks of angioedema.   Your attacks are more frequent or more severe despite preventive measures.   You have hereditary angioedema and are considering having children. It is important to discuss with your health care provider the risks of passing the condition on to your children.  SEEK IMMEDIATE MEDICAL CARE IF:   You have severe swelling of the mouth, tongue, or lips.  You have difficulty breathing.   You have difficulty swallowing.   You faint. MAKE SURE YOU:  Understand these instructions.  Will watch your condition.  Will get help right away if you are not doing well or get worse.   This information is not intended to replace advice given to you by your health care provider. Make sure you discuss any questions you have with your health care provider.   Document Released: 03/10/2001 Document Revised: 01/20/2014 Document Reviewed: 08/23/2012 Elsevier Interactive Patient Education [employer].

## 2015-07-24 NOTE — ED Provider Notes (Signed)
CSN: [id number]     Arrival date & time 07/24/15  6962 History   First MD Initiated Contact with Patient 07/24/15 [phone]     Chief Complaint  Patient presents with  . Insect Bite  . Oral Swelling     The history is provided by the patient. No language interpreter was used.   Devin Strickland is a 50 y.o. male who presents to the Emergency Department complaining of lip swelling.  He has a history of idiopathic angioedema. Yesterday he was stung by 1-2 bees on the right side of his face. He had no significant swelling in that time. This morning about 5:30 he awoke with swelling to his right upper lip. He has some local itching. His tongue feels a little funny but does not feel swollen at this time. No shortness of breath, fevers, vomiting.  He takes no medications. Symptoms are moderate and constant nature.  Past Medical History  Diagnosis Date  . 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 Angioedema    Past Surgical History  Procedure Laterality Date  . Embolectomy      occipital lobe   Family History  Problem Relation Age of Onset  . Hypertension Mother    Social History  Substance Use Topics  . Smoking status: Current Every Day Smoker    Types: Cigars  . Smokeless tobacco: Not on file  . Alcohol Use: Yes     Comment: Social    Review of Systems  All other systems reviewed and are negative.     Allergies  Review of patient's allergies indicates no known allergies.  Home Medications   Prior to Admission medications   Medication Sig Start Date End Date Taking? Authorizing Provider  diphenhydrAMINE (BENADRYL) 25 MG tablet Take 1-2 tablets (25-50 mg total) by mouth every 6 (six) hours as needed for itching. 07/24/15   Tilden Fossa, MD  famotidine (PEPCID) 20 MG tablet Take 1 tablet (20 mg total) by mouth 2 (two) times daily. 07/24/15   Tilden Fossa, MD  meloxicam (MOBIC) 7.5 MG tablet Take 1 tablet (7.5 mg total) by mouth daily. 05/14/14   Junius Finner, PA-C   methocarbamol (ROBAXIN) 500 MG tablet Take 1 tablet (500 mg total) by mouth 2 (two) times daily. 05/14/14   Junius Finner, PA-C  pantoprazole (PROTONIX) 20 MG tablet Take 1 tablet (20 mg total) by mouth daily. 08/07/13   Mancel Bale, MD  predniSONE (DELTASONE) 10 MG tablet Take 4 tablets (40 mg total) by mouth daily. 07/24/15   Tilden Fossa, MD  traMADol (ULTRAM) 50 MG tablet Take 1 tablet (50 mg total) by mouth every 6 (six) hours as needed. 05/14/14   Junius Finner, PA-C   BP 165/105 mmHg  Pulse 74  Temp(Src) 98.3 F (36.8 C) (Oral)  Resp 16  SpO2 99% Physical Exam  Constitutional: He is oriented to person, place, and time. He appears well-developed and well-nourished.  HENT:  Head: Normocephalic and atraumatic.  Right upper lip with a large amount of swelling and mild local tenderness. There is no swelling or edema of the tongue, submental region, posterior oropharynx.  Neck: Neck supple.  Cardiovascular: Normal rate and regular rhythm.   No murmur heard. Pulmonary/Chest: Effort normal and breath sounds normal. No stridor. No respiratory distress.  Lymphadenopathy:    He has no cervical adenopathy.  Neurological: He is alert and oriented to person, place, and time.  Skin: Skin is warm and dry.  Psychiatric: He  has a normal mood and affect. His behavior is normal.  Nursing note and vitals reviewed.   ED Course  Procedures (including critical care time) Labs Review Labs Reviewed - No data to display  Imaging Review No results found. I have personally reviewed and evaluated these images and lab results as part of my medical decision-making.   EKG Interpretation None      MDM   Final diagnoses:  Local reaction to insect sting, accidental or unintentional, initial encounter    Patient with history of angioedema here with lip swelling following a bee sting to his face. Examination is consistent with local reaction to an insect bite. He has no systemic symptoms or evidence  of airway compromise. Treating with prednisone. Discussed using ice packs to decrease the swelling. Discussed outpatient follow-up, home care, return precautions.  Tilden FossaElizabeth Dayden Viverette, MD 07/24/15 [phone]

## 2015-07-24 NOTE — ED Notes (Signed)
Pt stated he was stung yesterday by a yellow jacket on the right side of his face. Pt states initially his face had a tingling sensation but he woke up this morning once he noticed his lip was swelling up.

## 2015-07-24 NOTE — ED Notes (Signed)
Pt states he has had angioedema in the past.

## 2019-08-06 ENCOUNTER — Other Ambulatory Visit: Payer: Self-pay

## 2019-08-06 ENCOUNTER — Emergency Department (HOSPITAL_COMMUNITY)
Admission: EM | Admit: 2019-08-06 | Discharge: 2019-08-07 | Disposition: A | Discharge status: Home / Self Care | Payer: Self-pay | Attending: Emergency Medicine | Admitting: Emergency Medicine

## 2019-08-06 ENCOUNTER — Encounter (HOSPITAL_COMMUNITY): Payer: Self-pay | Admitting: Emergency Medicine

## 2019-08-06 DIAGNOSIS — Z5321 Procedure and treatment not carried out due to patient leaving prior to being seen by health care provider: Secondary | ICD-10-CM | POA: Insufficient documentation

## 2019-08-06 DIAGNOSIS — R519 Headache, unspecified: Secondary | ICD-10-CM | POA: Insufficient documentation

## 2019-08-06 NOTE — ED Triage Notes (Signed)
Pt presents to ED POV. Pt c/o abscess on top of head and headache. Pain is a 4/10.

## 2019-08-07 NOTE — ED Notes (Signed)
Pt states he is leaving °

## 2020-12-09 ENCOUNTER — Emergency Department (HOSPITAL_COMMUNITY)
Admission: EM | Admit: 2020-12-09 | Discharge: 2020-12-09 | Disposition: A | Discharge status: Home / Self Care | Payer: Self-pay | Attending: Emergency Medicine | Admitting: Emergency Medicine

## 2020-12-09 ENCOUNTER — Other Ambulatory Visit: Payer: Self-pay

## 2020-12-09 DIAGNOSIS — M545 Low back pain, unspecified: Secondary | ICD-10-CM | POA: Insufficient documentation

## 2020-12-09 DIAGNOSIS — F17290 Nicotine dependence, other tobacco product, uncomplicated: Secondary | ICD-10-CM | POA: Insufficient documentation

## 2020-12-09 DIAGNOSIS — G8918 Other acute postprocedural pain: Secondary | ICD-10-CM | POA: Insufficient documentation

## 2020-12-09 DIAGNOSIS — M25512 Pain in left shoulder: Secondary | ICD-10-CM | POA: Insufficient documentation

## 2020-12-09 MED ORDER — OXYCODONE-ACETAMINOPHEN 5-325 MG PO TABS: 1.0000 | ORAL_TABLET | Freq: Four times a day (QID) | ORAL | 0 refills | Status: AC | PRN

## 2020-12-09 MED ORDER — DIAZEPAM 5 MG PO TABS: 5.0000 mg | ORAL_TABLET | Freq: Four times a day (QID) | ORAL | 0 refills | Status: AC | PRN

## 2020-12-09 NOTE — Discharge Instructions (Signed)
Continue to use heat on the sore areas.  Do not drive within 4 hours of taking either of them pain reliever or muscle relaxer.  Follow-up with your surgeon, as scheduled for further evaluation and treatment.  Return here, if needed.

## 2020-12-09 NOTE — ED Triage Notes (Signed)
PT c/o L shoulder pain radiating down L arm. Rotator cuff surgery x 3weeks ago and out of prescribed oxycodone. [facility].

## 2020-12-09 NOTE — ED Provider Notes (Signed)
[facility] Provider Note   CSN: [id number] Arrival date & time: 12/09/20  1850     History Chief Complaint  Patient presents with   Shoulder Pain    Devin Strickland is a 55 y.o. male.  HPI He presents for evaluation of the pain and low back pain.  He has run out of his oxycodone, following recent left shoulder rotator cuff surgery.  He has a follow-up appointment, next week with his orthopedist.  It is also noticing pain in "the small, back," without known trauma to this area.  He denies radicular symptoms.  He denies fever, chills, cough or dizziness.  There are no other known active modifying factors.    Past Medical History:  Diagnosis Date   Angioedema    Embolism - blood clot    in occipital lobe    There are no problems to display for this patient.   Past Surgical History:  Procedure Laterality Date   EMBOLECTOMY     occipital lobe       Family History  Problem Relation Age of Onset   Hypertension Mother     Social History   Tobacco Use   Smoking status: Every Day    Types: Cigars  Substance Use Topics   Alcohol use: Yes    Comment: Social   Drug use: No    Home Medications Prior to Admission medications   Medication Sig Start Date End Date Taking? Authorizing Provider  diazepam (VALIUM) 5 MG tablet Take 1 tablet (5 mg total) by mouth every 6 (six) hours as needed for muscle spasms. 12/09/20  Yes Mancel Bale, MD  oxyCODONE-acetaminophen (PERCOCET) 5-325 MG tablet Take 1 tablet by mouth every 6 (six) hours as needed for severe pain or moderate pain. 12/09/20  Yes Mancel Bale, MD  diphenhydrAMINE (BENADRYL) 25 MG tablet Take 1-2 tablets (25-50 mg total) by mouth every 6 (six) hours as needed for itching. 07/24/15   Tilden Fossa, MD  famotidine (PEPCID) 20 MG tablet Take 1 tablet (20 mg total) by mouth 2 (two) times daily. 07/24/15   Tilden Fossa, MD  meloxicam (MOBIC) 7.5 MG tablet Take 1 tablet (7.5 mg  total) by mouth daily. 05/14/14   Lurene Shadow, PA-C  methocarbamol (ROBAXIN) 500 MG tablet Take 1 tablet (500 mg total) by mouth 2 (two) times daily. 05/14/14   Lurene Shadow, PA-C  pantoprazole (PROTONIX) 20 MG tablet Take 1 tablet (20 mg total) by mouth daily. 08/07/13   Mancel Bale, MD  predniSONE (DELTASONE) 10 MG tablet Take 4 tablets (40 mg total) by mouth daily. 07/24/15   Tilden Fossa, MD  traMADol (ULTRAM) 50 MG tablet Take 1 tablet (50 mg total) by mouth every 6 (six) hours as needed. 05/14/14   Lurene Shadow, PA-C    Allergies    Patient has no known allergies.  Review of Systems   Review of Systems  All other systems reviewed and are negative.  Physical Exam Updated Vital Signs BP (!) 173/98 (BP Location: Right Arm)   Pulse 90   Temp 98.8 F (37.1 C) (Oral)   Resp 16   Ht 5\' 8"  (1.727 m)   Wt 92.1 kg   SpO2 94%   BMI 30.87 kg/m   Physical Exam Vitals and nursing note reviewed.  Constitutional:      General: He is not in acute distress.    Appearance: He is well-developed. He is not ill-appearing, toxic-appearing or diaphoretic.  HENT:  Head: Normocephalic and atraumatic.     Right Ear: External ear normal.     Left Ear: External ear normal.  Eyes:     Conjunctiva/sclera: Conjunctivae normal.     Pupils: Pupils are equal, round, and reactive to light.  Neck:     Trachea: Phonation normal.  Cardiovascular:     Rate and Rhythm: Normal rate.  Pulmonary:     Effort: Pulmonary effort is normal.  Abdominal:     General: There is no distension.  Musculoskeletal:     Cervical back: Normal range of motion and neck supple.     Comments: Mild mid lower lumbar tenderness without deformity.  No significant pain in lower back with movement.  Left shoulder with decreased motion secondary to pain.  He has well-healed anterior and posterior arthroscopy wounds.  Neurovascular intact distally in the left hand.  Skin:    General: Skin is warm and dry.  Neurological:      Mental Status: He is alert and oriented to person, place, and time.     Cranial Nerves: No cranial nerve deficit.     Sensory: No sensory deficit.     Motor: No abnormal muscle tone.     Coordination: Coordination normal.  Psychiatric:        Mood and Affect: Mood normal.        Behavior: Behavior normal.        Thought Content: Thought content normal.        Judgment: Judgment normal.    ED Results / Procedures / Treatments   Labs (all labs ordered are listed, but only abnormal results are displayed) Labs Reviewed - No data to display  EKG None  Radiology No results found.  Procedures Procedures   Medications Ordered in ED Medications - No data to display  ED Course  I have reviewed the triage vital signs and the nursing notes.  Pertinent labs & imaging results that were available during my care of the patient were reviewed by me and considered in my medical decision making (see chart for details).    MDM Rules/Calculators/A&P                            Patient Vitals for the past 24 hrs:  BP Temp Temp src Pulse Resp SpO2 Height Weight  12/09/20 1858 (!) 173/98 98.8 F (37.1 C) Oral 90 16 94 % 5\' 8"  (1.727 m) 92.1 kg    7:38 PM Reevaluation with update and discussion. After initial assessment and treatment, an updated evaluation reveals no change in clinical status, findings discussed and questions and.   Medical Decision Making:  This patient is presenting for evaluation of postoperative left shoulder pain, which does require a range of treatment options, and is a complaint that involves a moderate risk of morbidity and mortality. The differential diagnoses include wound infection, complications from surgery, chronic shoulder pain. I decided to review old records, and in summary patient with increasing pain since running out of oxycodone, following recent rotator cuff surgery.  No clinical evidence for wound infection.  I did not require  additional historical information from anyone.    Critical Interventions-clinical evaluation  After These Interventions, the Patient was reevaluated and was found stable for discharge.  Postoperative pain without complications evident.  Will treat with pain medicine muscle relaxer pending follow-up with his orthopedist CRITICAL CARE-no Performed by: Mancel Bale  Nursing Notes Reviewed/ Care Coordinated Applicable Imaging  Reviewed Interpretation of Laboratory Data incorporated into ED treatment  The patient appears reasonably screened and/or stabilized for discharge and I doubt any other medical condition or other [facility] requiring further screening, evaluation, or treatment in the ED at this time prior to discharge.  Plan: Home Medications-continue usual; Home Treatments-heat to affected area; return here if the recommended treatment, does not improve the symptoms; Recommended follow up-orthopedics as scheduled     Final Clinical Impression(s) / ED Diagnoses Final diagnoses:  Left shoulder pain, unspecified chronicity    Rx / DC Orders ED Discharge Orders          Ordered    oxyCODONE-acetaminophen (PERCOCET) 5-325 MG tablet  Every 6 hours PRN        12/09/20 1927    diazepam (VALIUM) 5 MG tablet  Every 6 hours PRN        12/09/20 1927             Mancel Bale, MD 12/09/20 [phone]

## 2021-06-15 ENCOUNTER — Emergency Department (HOSPITAL_COMMUNITY)
Admission: EM | Admit: 2021-06-15 | Discharge: 2021-06-15 | Disposition: A | Discharge status: Home / Self Care | Payer: No Typology Code available for payment source | Attending: Emergency Medicine | Admitting: Emergency Medicine

## 2021-06-15 ENCOUNTER — Emergency Department (HOSPITAL_COMMUNITY): Payer: No Typology Code available for payment source

## 2021-06-15 ENCOUNTER — Encounter (HOSPITAL_COMMUNITY): Payer: Self-pay

## 2021-06-15 ENCOUNTER — Other Ambulatory Visit: Payer: Self-pay

## 2021-06-15 DIAGNOSIS — M25521 Pain in right elbow: Secondary | ICD-10-CM | POA: Diagnosis present

## 2021-06-15 DIAGNOSIS — Z79899 Other long term (current) drug therapy: Secondary | ICD-10-CM | POA: Diagnosis not present

## 2021-06-15 DIAGNOSIS — S5001XA Contusion of right elbow, initial encounter: Secondary | ICD-10-CM

## 2021-06-15 MED ORDER — OXYCODONE-ACETAMINOPHEN 5-325 MG PO TABS: 1.0000 | ORAL_TABLET | Freq: Four times a day (QID) | ORAL | 0 refills | Status: AC | PRN

## 2021-06-15 MED ORDER — OXYCODONE-ACETAMINOPHEN 5-325 MG PO TABS
2.0000 | ORAL_TABLET | Freq: Once | ORAL | Status: AC
  Administered 2021-06-15: 2 via ORAL
  Filled 2021-06-15: qty 2
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
## 2021-06-15 NOTE — Discharge Instructions (Addendum)
Follow-up at the [facility] for a possible elbow fracture as we talked about
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
## 2021-10-28 ENCOUNTER — Emergency Department (HOSPITAL_COMMUNITY)
Admission: EM | Admit: 2021-10-28 | Discharge: 2021-10-29 | Disposition: A | Discharge status: Home / Self Care | Payer: No Typology Code available for payment source | Attending: Student | Admitting: Student

## 2021-10-28 ENCOUNTER — Encounter (HOSPITAL_COMMUNITY): Payer: Self-pay

## 2021-10-28 ENCOUNTER — Other Ambulatory Visit: Payer: Self-pay

## 2021-10-28 ENCOUNTER — Emergency Department (HOSPITAL_COMMUNITY): Payer: No Typology Code available for payment source

## 2021-10-28 DIAGNOSIS — F17290 Nicotine dependence, other tobacco product, uncomplicated: Secondary | ICD-10-CM | POA: Insufficient documentation

## 2021-10-28 DIAGNOSIS — R0789 Other chest pain: Secondary | ICD-10-CM | POA: Diagnosis present

## 2021-10-28 DIAGNOSIS — M542 Cervicalgia: Secondary | ICD-10-CM | POA: Insufficient documentation

## 2021-10-28 DIAGNOSIS — R079 Chest pain, unspecified: Secondary | ICD-10-CM

## 2021-10-28 DIAGNOSIS — I10 Essential (primary) hypertension: Secondary | ICD-10-CM | POA: Diagnosis not present

## 2021-10-28 LAB — BASIC METABOLIC PANEL
Anion gap: 7 (ref 5–15)
BUN: 10 mg/dL (ref 6–20)
CO2: 23 mmol/L (ref 22–32)
Calcium: 9 mg/dL (ref 8.9–10.3)
Chloride: 110 mmol/L (ref 98–111)
Creatinine, Ser: 0.84 mg/dL (ref 0.61–1.24)
GFR, Estimated: >60 mL/min (ref >60)
Glucose, Bld: 106 mg/dL — ABNORMAL HIGH (ref 70–99)
Potassium: 3.9 mmol/L (ref 3.5–5.1)
Sodium: 140 mmol/L (ref 135–145)

## 2021-10-28 LAB — CBC
HCT: 38.3 % — ABNORMAL LOW (ref 39.0–52.0)
Hemoglobin: 13.1 g/dL (ref 13.0–17.0)
MCH: 31 pg (ref 26.0–34.0)
MCHC: 34.2 g/dL (ref 30.0–36.0)
MCV: 90.8 fL (ref 80.0–100.0)
Platelets: 330 10*3/uL (ref 150–400)
RBC: 4.22 MIL/uL (ref 4.22–5.81)
RDW: 13.9 % (ref 11.5–15.5)
WBC: 7.6 10*3/uL (ref 4.0–10.5)
nRBC: 0 % (ref 0.0–0.2)

## 2021-10-28 LAB — TROPONIN I (HIGH SENSITIVITY)
Troponin I (High Sensitivity): 5 ng/L (ref <18)
Troponin I (High Sensitivity): 5 ng/L (ref <18)

## 2021-10-28 NOTE — ED Provider Triage Note (Signed)
Emergency Medicine Provider Triage Evaluation Note  Devin Strickland , a 56 y.o. male  was evaluated in triage.  Pt complains of chest pain, shortness of breath.  Patient reports that this been ongoing for the last 2 weeks, he is been seen at 2 hospitals with reassuring work-ups and discharge.  The patient reports that he was seen at an urgent care this morning and had abnormal EKG and was sent here for further management.  The patient is endorsing chest pain, shortness of breath however denies any nausea, vomiting, abdominal pain, diarrhea, fevers.  Patient reports cardiac catheterization last Tuesday however is unable to tell with the results of this cardiac catheterization.  Review of Systems  Positive:  Negative:   Physical Exam  BP (!) 157/95   Pulse 66   Temp 98.5 F (36.9 C) (Oral)   Resp 16   SpO2 98%  Gen:   Awake, no distress   Resp:  Normal effort  MSK:   Moves extremities without difficulty  Other:    Medical Decision Making  Medically screening exam initiated at 11:37 AM.  Appropriate orders placed.  ZAKARIYE NEE was informed that the remainder of the evaluation will be completed by another provider, this initial triage assessment does not replace that evaluation, and the importance of remaining in the ED until their evaluation is complete.     Azucena Cecil, PA-C 10/28/21 1138

## 2021-10-28 NOTE — ED Triage Notes (Signed)
Patient sent from [facility] hospital with ongoing intermittent chest pain. Patient has been seen x 2 at hospitals and reports cardiac cath last week with no blockage. Alert and oriented, NAD. Describes the pain as pressure

## 2021-10-29 MED ORDER — ACETAMINOPHEN 500 MG PO TABS: 1000.0000 mg | ORAL_TABLET | Freq: Three times a day (TID) | ORAL | 0 refills | Status: AC

## 2021-10-29 MED ORDER — KETOROLAC TROMETHAMINE 15 MG/ML IJ SOLN
15.0000 mg | Freq: Once | INTRAMUSCULAR | Status: AC
  Administered 2021-10-29: 15 mg via INTRAVENOUS
  Filled 2021-10-29: qty 1

## 2021-10-29 MED ORDER — NAPROXEN 375 MG PO TABS: 375.0000 mg | ORAL_TABLET | Freq: Two times a day (BID) | ORAL | 0 refills | Status: AC | PRN

## 2021-10-29 NOTE — ED Provider Notes (Signed)
[facility] EMERGENCY DEPARTMENT Provider Note  CSN: [id number] Arrival date & time: 10/28/21 1102  Chief Complaint(s) No chief complaint on file.  HPI Devin Strickland is a 57 y.o. male with PMH angioedema, CVA, HTN who presents emergency department for evaluation of chest pain.  He states that he has been dealing with this pain since the beginning of October and has 2 emergency department visits on 10/2 and 10/3 at [facility] for atypical chest pain.  He did have abnormal T wave inversions on his ECG, but reviewing his EKG here in the [facility] system 10 years ago it appears that these have not significantly changed.  Unsure if they did not have access to this data but patient ultimately received a cardiac catheterization on 10/15/2021 which was reassuringly normal.  No stent placement.  D-dimer testing was negative then.  Patient ultimately discharged and followed up in the office at the [facility] yesterday with persistent complaints of chest pain and they recommended him come back to the emergency department due to unresolved chest pain of which he decided to drive himself to [facility] health stating that he did not feel like he really knew what was going on at [facility].  He states that the pain began when he slept awkwardly on a couch and he was worried that it primarily felt like a "kink" in his neck.  He endorses pain along the SCM when looking to the right as well as persistent dull pressure-like chest pain on the right side of the chest.  States that the pain is worse in the morning and at night and resolves during the day.  No association with exertion, no associated shortness of breath, diaphoresis, nausea, vomiting or other systemic symptoms.   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  Home Medication(s) Prior to Admission medications    Medication Sig Start Date End Date Taking? Authorizing Provider  diazepam (VALIUM) 5 MG tablet Take 1 tablet (5 mg total) by mouth every 6 (six) hours as needed for muscle spasms. Patient not taking: Reported on 06/15/2021 12/09/20   Mancel Bale, MD  diphenhydrAMINE (BENADRYL) 25 MG tablet Take 1-2 tablets (25-50 mg total) by mouth every 6 (six) hours as needed for itching. Patient not taking: Reported on 06/15/2021 07/24/15   Tilden Fossa, MD  famotidine (PEPCID) 20 MG tablet Take 1 tablet (20 mg total) by mouth 2 (two) times daily. Patient not taking: Reported on 06/15/2021 07/24/15   Tilden Fossa, MD  meloxicam (MOBIC) 7.5 MG tablet Take 1 tablet (7.5 mg total) by mouth daily. Patient not taking: Reported on 06/15/2021 05/14/14   Lurene Shadow, PA-C  methocarbamol (ROBAXIN) 500 MG tablet Take 1 tablet (500 mg total) by mouth 2 (two) times daily. Patient not taking: Reported on 06/15/2021 05/14/14   Lurene Shadow, PA-C  oxyCODONE-acetaminophen (PERCOCET) 5-325 MG tablet Take 1 tablet by mouth every 6 (six) hours as needed for severe pain or moderate pain. Patient not taking: Reported on 06/15/2021 12/09/20   Mancel Bale, MD  oxyCODONE-acetaminophen (PERCOCET/ROXICET) 5-325 MG tablet Take 1-2 tablets by mouth every 6 (six) hours as needed for severe pain. 06/15/21   Lorre Nick, MD  pantoprazole (PROTONIX) 20 MG tablet Take 1 tablet (20 mg total) by mouth daily. Patient not taking: Reported on 06/15/2021 08/07/13  Mancel Bale, MD  predniSONE (DELTASONE) 10 MG tablet Take 4 tablets (40 mg total) by mouth daily. Patient not taking: Reported on 06/15/2021 07/24/15   Tilden Fossa, MD  traMADol (ULTRAM) 50 MG tablet Take 1 tablet (50 mg total) by mouth every 6 (six) hours as needed. Patient not taking: Reported on 06/15/2021 05/14/14   Rolla Plate                                                                                                                                    Past Surgical  History Past Surgical History:  Procedure Laterality Date   EMBOLECTOMY     occipital lobe   Family History Family History  Problem Relation Age of Onset   Hypertension Mother     Social History Social History   Tobacco Use   Smoking status: Every Day    Types: Cigars  Substance Use Topics   Alcohol use: Yes    Comment: Social   Drug use: No   Allergies Patient has no known allergies.  Review of Systems Review of Systems  Cardiovascular:  Positive for chest pain.  Musculoskeletal:  Positive for neck pain.    Physical Exam Vital Signs  I have reviewed the triage vital signs BP (!) 159/94   Pulse 68   Temp 98 F (36.7 C)   Resp 16   SpO2 98%   Physical Exam Vitals and nursing note reviewed.  Constitutional:      General: He is not in acute distress.    Appearance: He is well-developed.  HENT:     Head: Normocephalic and atraumatic.  Eyes:     Conjunctiva/sclera: Conjunctivae normal.  Cardiovascular:     Rate and Rhythm: Normal rate and regular rhythm.     Heart sounds: No murmur heard. Pulmonary:     Effort: Pulmonary effort is normal. No respiratory distress.     Breath sounds: Normal breath sounds.  Abdominal:     Palpations: Abdomen is soft.     Tenderness: There is no abdominal tenderness.  Musculoskeletal:        General: Tenderness present. No swelling.     Cervical back: Neck supple. Tenderness present.  Skin:    General: Skin is warm and dry.     Capillary Refill: Capillary refill takes less than 2 seconds.  Neurological:     Mental Status: He is alert.  Psychiatric:        Mood and Affect: Mood normal.     ED Results and Treatments Labs (all labs ordered are listed, but only abnormal results are displayed) Labs Reviewed  BASIC METABOLIC PANEL - Abnormal; Notable for the following components:      Result Value   Glucose, Bld 106 (*)    All other components within normal limits  CBC - Abnormal; Notable for the following components:    HCT 38.3 (*)    All other  components within normal limits  TROPONIN I (HIGH SENSITIVITY)  TROPONIN I (HIGH SENSITIVITY)                                                                                                                          Radiology DG Chest 2 View  Result Date: 10/28/2021 CLINICAL DATA:  Chest pain EXAM: CHEST - 2 VIEW COMPARISON:  05/21/2012 chest radiograph. FINDINGS: Stable cardiomediastinal silhouette with normal heart size. No pneumothorax. No pleural effusion. Lungs appear clear, with no acute consolidative airspace disease and no pulmonary edema. IMPRESSION: No active cardiopulmonary disease. Electronically Signed   By: Delbert Phenix M.D.   On: 10/28/2021 12:05    Pertinent labs & imaging results that were available during my care of the patient were reviewed by me and considered in my medical decision making (see MDM for details).  Medications Ordered in ED Medications - No data to display                                                                                                                                   Procedures Procedures  (including critical care time)  Medical Decision Making / ED Course   This patient presents to the ED for concern of chest pain, this involves an extensive number of treatment options, and is a complaint that carries with it a high risk of complications and morbidity.  The differential diagnosis includes musculoskeletal pain, costochondritis, ACS, PE, pneumonia, precordial catch  MDM: Patient seen emergency room for evaluation of chest and neck pain.  Physical exam with reproducible tenderness over the SCM and the muscles of the lateral pectoralis on the right.  Laboratory evaluation unremarkable including high-sensitivity troponins.  Chest x-ray unremarkable.  The ECG in question previously looks quite similar to EKG seen in 2015 and I do not think that these are current ischemic changes.  I spent a long time with the  patient discussing his recent care and given that the patient recently had a cardiac catheterization a little over 2 weeks ago and his pain is not significantly different today compared to then with normal troponins, I have overall low suspicion for ACS at this time.  Patient is low risk by Wells criteria and I have low suspicion for PE.  We trialed a dose of Toradol and his pain significantly improved.  Suspect patient's pain is likely musculoskeletal in nature and he  will be placed on Tylenol regimen with Naprosyn for breakthrough.  Patient then discharged with outpatient follow-up.   Additional history obtained:  -External records from outside source obtained and reviewed including: Chart review including previous notes, labs, imaging, consultation notes   Lab Tests: -I ordered, reviewed, and interpreted labs.   The pertinent results include:   Labs Reviewed  BASIC METABOLIC PANEL - Abnormal; Notable for the following components:      Result Value   Glucose, Bld 106 (*)    All other components within normal limits  CBC - Abnormal; Notable for the following components:   HCT 38.3 (*)    All other components within normal limits  TROPONIN I (HIGH SENSITIVITY)  TROPONIN I (HIGH SENSITIVITY)      EKG   EKG Interpretation  Date/Time:  Monday October 28 2021 11:33:04 EDT Ventricular Rate:  72 PR Interval:  172 QRS Duration: 104 QT Interval:  392 QTC Calculation: 429 R Axis:   -3 Text Interpretation: Normal sinus rhythm Incomplete right bundle branch block T wave inversions seen in previous ECG on 08/07/2013 When compared with ECG of 07-Aug-2013 13:32, PREVIOUS ECG IS PRESENT Confirmed by Ronny Korff ([id number]) on 10/29/2021 7:43:18 AM         Imaging Studies ordered: I ordered imaging studies including chest x-ray I independently visualized and interpreted imaging. I agree with the radiologist interpretation   Medicines ordered and prescription drug management: No orders of  the defined types were placed in this encounter.   -I have reviewed the patients home medicines and have made adjustments as needed  Critical interventions none   Cardiac Monitoring: The patient was maintained on a cardiac monitor.  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  Patient safe for discharge with outpatient follow-up.     Final Clinical Impression(s) / ED Diagnoses Final diagnoses:  None     @PCDICTATION @    Teressa Lower, MD 10/29/21 1416

## 2021-10-29 NOTE — ED Notes (Signed)
Patient refused vitals. States "they can get them when I go back there."

## 2021-12-11 ENCOUNTER — Emergency Department (HOSPITAL_COMMUNITY)
Admission: EM | Admit: 2021-12-11 | Discharge: 2021-12-11 | Disposition: A | Discharge status: Home / Self Care | Payer: No Typology Code available for payment source | Attending: Emergency Medicine | Admitting: Emergency Medicine

## 2021-12-11 ENCOUNTER — Emergency Department (HOSPITAL_COMMUNITY): Payer: No Typology Code available for payment source

## 2021-12-11 DIAGNOSIS — M25562 Pain in left knee: Secondary | ICD-10-CM | POA: Diagnosis present

## 2021-12-11 MED ORDER — OXYCODONE-ACETAMINOPHEN 5-325 MG PO TABS
1.0000 | ORAL_TABLET | Freq: Once | ORAL | Status: AC
  Administered 2021-12-11: 1 via ORAL
  Filled 2021-12-11: qty 1
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
## 2021-12-11 NOTE — Discharge Instructions (Addendum)
You are seen in the ER today for your left knee pain.  There are no broken bones on your x-ray.  You may have some inflammation of the bursa of your left knee which is a sac of fluid used for questioning of the knee.  You may use anti-inflammatory medication such as ibuprofen OR naproxen (do not use both together) to help treat this.  You rest the knee, use the crutches as needed and follow-up with either your [facility] providers or with the orthopedic provider listed below.  Return to the ER for develop any redness, worsening swelling, significant worsening discomfort in the knee, fevers chills or any other new severe symptoms.

## 2023-10-25 ENCOUNTER — Other Ambulatory Visit: Payer: Self-pay

## 2023-10-25 ENCOUNTER — Emergency Department (HOSPITAL_COMMUNITY)
Admission: EM | Admit: 2023-10-25 | Discharge: 2023-10-25 | Disposition: A | Discharge status: Home / Self Care | Attending: Emergency Medicine | Admitting: Emergency Medicine

## 2023-10-25 DIAGNOSIS — W228XXA Striking against or struck by other objects, initial encounter: Secondary | ICD-10-CM | POA: Insufficient documentation

## 2023-10-25 DIAGNOSIS — S0990XA Unspecified injury of head, initial encounter: Secondary | ICD-10-CM | POA: Diagnosis present
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
## 2023-10-25 NOTE — ED Notes (Signed)
 Discharge instructions, medications, and follow up care reviewed with and provided to pt. Pt denies any further questions, and has verbalized understanding.
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
## 2023-10-25 NOTE — Discharge Instructions (Signed)
 Clean abrasion twice a day with soap and water and take Tylenol  for pain.  Follow-up if any problems
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
